# Patient Record
Sex: Female | Born: 1970
Health system: Southern US, Community
[De-identification: ages and names within clinical notes are randomized; demographics above are authoritative.]

## PROBLEM LIST (undated history)

## (undated) DIAGNOSIS — N809 Endometriosis, unspecified: Secondary | ICD-10-CM

## (undated) DIAGNOSIS — J45909 Unspecified asthma, uncomplicated: Secondary | ICD-10-CM

---

## 2016-12-06 ENCOUNTER — Emergency Department (HOSPITAL_COMMUNITY)
Admission: EM | Admit: 2016-12-06 | Discharge: 2016-12-06 | Disposition: A | Discharge status: Home / Self Care | Payer: Medicaid - Out of State | Attending: Emergency Medicine | Admitting: Emergency Medicine

## 2016-12-06 ENCOUNTER — Encounter (HOSPITAL_COMMUNITY): Payer: Self-pay | Admitting: Emergency Medicine

## 2016-12-06 DIAGNOSIS — G43A0 Cyclical vomiting, not intractable: Secondary | ICD-10-CM | POA: Diagnosis not present

## 2016-12-06 DIAGNOSIS — J45909 Unspecified asthma, uncomplicated: Secondary | ICD-10-CM | POA: Diagnosis not present

## 2016-12-06 DIAGNOSIS — R1013 Epigastric pain: Secondary | ICD-10-CM

## 2016-12-06 HISTORY — DX: Endometriosis, unspecified: N80.9

## 2016-12-06 HISTORY — DX: Unspecified asthma, uncomplicated: J45.909

## 2016-12-06 LAB — I-STAT BETA HCG BLOOD, ED (MC, WL, AP ONLY)

## 2016-12-06 LAB — CBC WITH DIFFERENTIAL/PLATELET
Basophils Absolute: 0 10*3/uL (ref 0.0–0.1)
Basophils Relative: 0 %
Eosinophils Absolute: 0 10*3/uL (ref 0.0–0.7)
Eosinophils Relative: 0 %
HCT: 33.8 % — ABNORMAL LOW (ref 36.0–46.0)
HEMOGLOBIN: 10.9 g/dL — AB (ref 12.0–15.0)
LYMPHS ABS: 1.4 10*3/uL (ref 0.7–4.0)
LYMPHS PCT: 9 %
MCH: 25.7 pg — AB (ref 26.0–34.0)
MCHC: 32.2 g/dL (ref 30.0–36.0)
MCV: 79.7 fL (ref 78.0–100.0)
Monocytes Absolute: 0.4 10*3/uL (ref 0.1–1.0)
Monocytes Relative: 2 %
NEUTROS PCT: 89 %
Neutro Abs: 14 10*3/uL — ABNORMAL HIGH (ref 1.7–7.7)
Platelets: 404 10*3/uL — ABNORMAL HIGH (ref 150–400)
RBC: 4.24 MIL/uL (ref 3.87–5.11)
RDW: 16.9 % — ABNORMAL HIGH (ref 11.5–15.5)
WBC: 15.8 10*3/uL — AB (ref 4.0–10.5)

## 2016-12-06 LAB — COMPREHENSIVE METABOLIC PANEL
ALK PHOS: 84 U/L (ref 38–126)
ALT: 19 U/L (ref 14–54)
AST: 19 U/L (ref 15–41)
Albumin: 4.3 g/dL (ref 3.5–5.0)
Anion gap: 9 (ref 5–15)
BUN: 13 mg/dL (ref 6–20)
CALCIUM: 9.7 mg/dL (ref 8.9–10.3)
CO2: 26 mmol/L (ref 22–32)
CREATININE: 0.68 mg/dL (ref 0.44–1.00)
Chloride: 101 mmol/L (ref 101–111)
Glucose, Bld: 127 mg/dL — ABNORMAL HIGH (ref 65–99)
Potassium: 3.6 mmol/L (ref 3.5–5.1)
Sodium: 136 mmol/L (ref 135–145)
Total Bilirubin: 0.2 mg/dL — ABNORMAL LOW (ref 0.3–1.2)
Total Protein: 8 g/dL (ref 6.5–8.1)

## 2016-12-06 LAB — LIPASE, BLOOD: LIPASE: 21 U/L (ref 11–51)

## 2016-12-06 MED ORDER — METOCLOPRAMIDE HCL 5 MG/ML IJ SOLN
10.0000 mg | Freq: Once | INTRAMUSCULAR | Status: AC
  Administered 2016-12-06: 10 mg via INTRAVENOUS
  Filled 2016-12-06: qty 2

## 2016-12-06 MED ORDER — ONDANSETRON 4 MG PO TBDP: 4.0000 mg | ORAL_TABLET | Freq: Three times a day (TID) | ORAL | 0 refills | Status: AC | PRN

## 2016-12-06 MED ORDER — GI COCKTAIL ~~LOC~~
30.0000 mL | Freq: Once | ORAL | Status: AC
  Administered 2016-12-06: 30 mL via ORAL
  Filled 2016-12-06: qty 30

## 2016-12-06 MED ORDER — ONDANSETRON HCL 4 MG/2ML IJ SOLN: 4.0000 mg | Freq: Once | INTRAMUSCULAR | Status: DC

## 2016-12-06 MED FILL — ONDANSETRON ODT 4 MG TABLET: 4 | 5 days supply | Qty: 15 | Fill #0

## 2016-12-06 NOTE — ED Triage Notes (Signed)
Per PT[facility], patient from home, c/o abdominal pain with N/V/D since 0300 today. Denies chest pain and SOB. Ambulatory.

## 2016-12-06 NOTE — ED Notes (Signed)
RX X 1 GIVEN 

## 2016-12-06 NOTE — ED Notes (Signed)
Pt was given Sprite and able to keep it down with no problem. RN aware.

## 2016-12-06 NOTE — ED Provider Notes (Signed)
[facility]-EMERGENCY DEPT Provider Note  CSN: [id number] Arrival date & time: 12/06/16 [id number]  Chief Complaint(s) Abdominal Pain  HPI Melissa Lyons is a 46 y.o. female who presents to the emergency department with 6 hours of intermittent abdominal discomfort with associated nausea and nonbloody nonbilious emesis.  Patient reports that she was partying last night and endorsed marijuana use, alcohol consumption.  States that she awoke this morning with the nausea resulting in vomiting.  Abdominal discomfort began after the emesis.  No alleviating or aggravating factors.  Abdominal pain is in epigastric and left upper quadrant.  Mild to moderate in intensity.  She denies any fevers, chills, diarrhea.  No known suspicious food intake.  States that she had grilled chicken salad which is what everybody else had.  No recent antibiotics or travel.  No known sick contacts.  HPI  Past Medical History Past Medical History:  Diagnosis Date  . Asthma   . Endometriosis    There are no active problems to display for this patient.  Home Medication(s) Prior to Admission medications   Medication Sig Start Date End Date Taking? Authorizing Provider  ondansetron (ZOFRAN ODT) 4 MG disintegrating tablet Take 1 tablet (4 mg total) by mouth every 8 (eight) hours as needed for up to 3 days for nausea or vomiting. 12/06/16 12/09/16  Nira Connardama, Pedro Eduardo, MD                                                                                                                                    Past Surgical History History reviewed. No pertinent surgical history. Family History No family history on file.  Social History Social History   Tobacco Use  . Smoking status: Not on file  Substance Use Topics  . Alcohol use: Not on file  . Drug use: Not on file   Allergies Penicillins  Review of Systems Review of Systems All other systems are reviewed and are negative for acute change except as  noted in the HPI  Physical Exam Vital Signs  I have reviewed the triage vital signs BP (!) 145/77   Pulse 92   Temp 99.8 F (37.7 C) (Oral)   Resp 20   Ht 5\' 4"  (1.626 m)   Wt 99.8 kg (220 lb)   SpO2 100%   BMI 37.76 kg/m   Physical Exam  Constitutional: She is oriented to person, place, and time. She appears well-developed and well-nourished. No distress.  HENT:  Head: Normocephalic and atraumatic.  Nose: Nose normal.  Eyes: Conjunctivae and EOM are normal. Pupils are equal, round, and reactive to light. Right eye exhibits no discharge. Left eye exhibits no discharge. No scleral icterus.  Neck: Normal range of motion. Neck supple.  Cardiovascular: Normal rate and regular rhythm. Exam reveals no gallop and no friction rub.  No murmur heard. Pulmonary/Chest: Effort normal and breath sounds normal. No stridor. No respiratory distress. She has no  rales.  Abdominal: Soft. She exhibits no distension. There is no tenderness (palpation of abdomen improves pain). There is no rigidity, no rebound, no guarding and no CVA tenderness. No hernia.  Musculoskeletal: She exhibits no edema or tenderness.  Neurological: She is alert and oriented to person, place, and time.  Skin: Skin is warm and dry. No rash noted. She is not diaphoretic. No erythema.  Psychiatric: She has a normal mood and affect.  Vitals reviewed.   ED Results and Treatments Labs (all labs ordered are listed, but only abnormal results are displayed) Labs Reviewed  CBC WITH DIFFERENTIAL/PLATELET - Abnormal; Notable for the following components:      Result Value   WBC 15.8 (*)    Hemoglobin 10.9 (*)    HCT 33.8 (*)    MCH 25.7 (*)    RDW 16.9 (*)    Platelets 404 (*)    Neutro Abs 14.0 (*)    All other components within normal limits  COMPREHENSIVE METABOLIC PANEL - Abnormal; Notable for the following components:   Glucose, Bld 127 (*)    Total Bilirubin 0.2 (*)    All other components within normal limits    LIPASE, BLOOD  I-STAT BETA HCG BLOOD, ED ([facility], [facility], [facility] ONLY)                                                                                                                         EKG  EKG Interpretation  Date/Time:    Ventricular Rate:    PR Interval:    QRS Duration:   QT Interval:    QTC Calculation:   R Axis:     Text Interpretation:        Radiology No results found. Pertinent labs & imaging results that were available during my care of the patient were reviewed by me and considered in my medical decision making (see chart for details).  Medications Ordered in ED Medications  gi cocktail (Maalox,Lidocaine,Donnatal) (30 mLs Oral Given 12/06/16 1019)  metoCLOPramide (REGLAN) injection 10 mg (10 mg Intravenous Given 12/06/16 1018)                                                                                                                                    Procedures Procedures  EMERGENCY DEPARTMENT BILIARY ULTRASOUND INTERPRETATION "Study: Limited Abdominal Ultrasound of the Gallbladder and Common Bile Duct."  INDICATIONS: Abdominal pain and Nausea  and vomiting Indication: Multiple views of the gallbladder and common bile duct were obtained in real-time with a Multi-frequency probe."  PERFORMED BY:  Myself IMAGES ARCHIVED?: Yes LIMITATIONS: Body habitus and Bowel gas INTERPRETATION: Normal, Gallbladder wall normal in thickness, Sonographic Murphy's sign abscent and Common bile duct normal in size   (including critical care time)  Medical Decision Making / ED Course I have reviewed the nursing notes for this encounter and the patient's prior records (if available in EHR or on provided paperwork).    Epigastric and left upper quadrant abdominal pain with associated nausea and vomiting.  Abdomen benign.  Palpation actually improved the patient's abdominal discomfort.  Labs did reveal leukocytosis which is likely demargination from the vomiting.  Otherwise labs are  grossly reassuring without evidence of biliary obstruction or pancreatitis.  Bedside ultrasound without cholelithiasis or evidence of adjusting acute cholecystitis.  Patient was treated symptomatically with antiemetic.  Able to tolerate oral intake.  Low suspicion for serious intra-abdominal inflammatory/infectious process/small bowel obstruction.  No need for advanced imaging at this time.  The patient appears reasonably screened and/or stabilized for discharge and I doubt any other medical condition or other [facility] requiring further screening, evaluation, or treatment in the ED at this time prior to discharge.  The patient is safe for discharge with strict return precautions.   Final Clinical Impression(s) / ED Diagnoses Final diagnoses:  Epigastric pain  Cyclical vomiting with nausea, intractability of vomiting not specified   Disposition: Discharge  Condition: Good  I have discussed the results, Dx and Tx plan with the patient who expressed understanding and agree(s) with the plan. Discharge instructions discussed at great length. The patient was given strict return precautions who verbalized understanding of the instructions. No further questions at time of discharge.    ED Discharge Orders        Ordered    ondansetron (ZOFRAN ODT) 4 MG disintegrating tablet  Every 8 hours PRN     12/06/16 1243       Follow Up: Primary care provider   in 5-7 days, If symptoms do not improve or  worsen      This chart was dictated using voice recognition software.  Despite best efforts to proofread,  errors can occur which can change the documentation meaning.   Nira Connardama, Pedro Eduardo, MD 12/06/16 1248

## 2016-12-06 NOTE — ED Notes (Signed)
URINE COLLECTED AT BEDSIDE

## 2016-12-06 NOTE — ED Notes (Signed)
ED Provider at bedside. 

## 2016-12-06 NOTE — ED Notes (Signed)
Bed: WA06 Expected date:  Expected time:  Means of arrival:  Comments: EMS abd pain 

## 2016-12-06 NOTE — Discharge Instructions (Signed)

## 2017-04-17 ENCOUNTER — Encounter (HOSPITAL_COMMUNITY): Payer: Self-pay | Admitting: Emergency Medicine

## 2017-04-17 ENCOUNTER — Emergency Department (HOSPITAL_COMMUNITY)
Admission: EM | Admit: 2017-04-17 | Discharge: 2017-04-18 | Disposition: A | Discharge status: Home / Self Care | Payer: Medicaid - Out of State | Attending: Emergency Medicine | Admitting: Emergency Medicine

## 2017-04-17 ENCOUNTER — Emergency Department (HOSPITAL_COMMUNITY)
Admission: EM | Admit: 2017-04-17 | Discharge: 2017-04-17 | Discharge status: Against Medical Advice | Payer: Medicaid - Out of State | Source: Home / Self Care | Attending: Emergency Medicine | Admitting: Emergency Medicine

## 2017-04-17 ENCOUNTER — Emergency Department (HOSPITAL_COMMUNITY): Payer: Medicaid - Out of State

## 2017-04-17 ENCOUNTER — Other Ambulatory Visit: Payer: Self-pay

## 2017-04-17 DIAGNOSIS — R112 Nausea with vomiting, unspecified: Secondary | ICD-10-CM | POA: Diagnosis not present

## 2017-04-17 DIAGNOSIS — F1290 Cannabis use, unspecified, uncomplicated: Secondary | ICD-10-CM | POA: Diagnosis not present

## 2017-04-17 DIAGNOSIS — J45909 Unspecified asthma, uncomplicated: Secondary | ICD-10-CM | POA: Insufficient documentation

## 2017-04-17 DIAGNOSIS — R1010 Upper abdominal pain, unspecified: Secondary | ICD-10-CM | POA: Diagnosis present

## 2017-04-17 DIAGNOSIS — R109 Unspecified abdominal pain: Secondary | ICD-10-CM | POA: Diagnosis not present

## 2017-04-17 DIAGNOSIS — F17210 Nicotine dependence, cigarettes, uncomplicated: Secondary | ICD-10-CM | POA: Insufficient documentation

## 2017-04-17 LAB — COMPREHENSIVE METABOLIC PANEL
ALBUMIN: 4.2 g/dL (ref 3.5–5.0)
ALK PHOS: 78 U/L (ref 38–126)
ALT: 17 U/L (ref 14–54)
ANION GAP: 12 (ref 5–15)
AST: 19 U/L (ref 15–41)
BUN: 5 mg/dL — ABNORMAL LOW (ref 6–20)
CALCIUM: 9.5 mg/dL (ref 8.9–10.3)
CO2: 22 mmol/L (ref 22–32)
CREATININE: 0.77 mg/dL (ref 0.44–1.00)
Chloride: 104 mmol/L (ref 101–111)
GFR calc Af Amer: >60 mL/min (ref >60)
GFR calc non Af Amer: >60 mL/min (ref >60)
GLUCOSE: 139 mg/dL — AB (ref 65–99)
Potassium: 3.5 mmol/L (ref 3.5–5.1)
SODIUM: 138 mmol/L (ref 135–145)
Total Bilirubin: 0.6 mg/dL (ref 0.3–1.2)
Total Protein: 7.9 g/dL (ref 6.5–8.1)

## 2017-04-17 LAB — CBC WITH DIFFERENTIAL/PLATELET
BASOS ABS: 0 10*3/uL (ref 0.0–0.1)
Basophils Relative: 0 %
EOS PCT: 0 %
Eosinophils Absolute: 0.1 10*3/uL (ref 0.0–0.7)
HEMATOCRIT: 39.9 % (ref 36.0–46.0)
Hemoglobin: 13 g/dL (ref 12.0–15.0)
LYMPHS ABS: 2.6 10*3/uL (ref 0.7–4.0)
LYMPHS PCT: 19 %
MCH: 27.9 pg (ref 26.0–34.0)
MCHC: 32.6 g/dL (ref 30.0–36.0)
MCV: 85.6 fL (ref 78.0–100.0)
Monocytes Absolute: 0.4 10*3/uL (ref 0.1–1.0)
Monocytes Relative: 3 %
NEUTROS ABS: 10.6 10*3/uL — AB (ref 1.7–7.7)
Neutrophils Relative %: 78 %
PLATELETS: 349 10*3/uL (ref 150–400)
RBC: 4.66 MIL/uL (ref 3.87–5.11)
RDW: 16.6 % — ABNORMAL HIGH (ref 11.5–15.5)
WBC: 13.7 10*3/uL — AB (ref 4.0–10.5)

## 2017-04-17 LAB — LIPASE, BLOOD: LIPASE: 20 U/L (ref 11–51)

## 2017-04-17 LAB — I-STAT BETA HCG BLOOD, ED (MC, WL, AP ONLY): I-stat hCG, quantitative: 5.6 m[IU]/mL — ABNORMAL HIGH (ref <5)

## 2017-04-17 MED ORDER — ONDANSETRON 4 MG PO TBDP
8.0000 mg | ORAL_TABLET | Freq: Once | ORAL | Status: AC
  Administered 2017-04-17: 8 mg via ORAL
  Filled 2017-04-17: qty 2

## 2017-04-17 MED ORDER — ACETAMINOPHEN 325 MG PO TABS
650.0000 mg | ORAL_TABLET | Freq: Once | ORAL | Status: AC
  Administered 2017-04-17: 650 mg via ORAL
  Filled 2017-04-17: qty 2

## 2017-04-17 MED ORDER — ONDANSETRON 4 MG PO TBDP
4.0000 mg | ORAL_TABLET | Freq: Once | ORAL | Status: AC
  Administered 2017-04-17: 4 mg via ORAL
  Filled 2017-04-17: qty 1

## 2017-04-17 NOTE — ED Notes (Signed)
Pregnancy negative per LAB

## 2017-04-17 NOTE — ED Notes (Signed)
Pt called x3. No response 

## 2017-04-17 NOTE — ED Notes (Signed)
Pt is putting her finger in her mouth to induce vomiting. Witnessed this x 2.

## 2017-04-17 NOTE — ED Triage Notes (Signed)
Pt to ED via [facility] with c/o generalized abd pain onset this am.  Pt was here earlier today and left after triage because she said she couldn't wait that long.  After pt left ED she called EMS and returned to ED

## 2017-04-17 NOTE — ED Notes (Signed)
Pt called x 2 in all areas of lobby with no response.

## 2017-04-17 NOTE — ED Triage Notes (Signed)
Pt complains of generalized abd pain that started this morning. Pt also reports n/v. Denies diarrhea. Pt  feeling SOB. Denies CP.

## 2017-04-17 NOTE — ED Provider Notes (Signed)
Patient placed in Quick Look pathway, seen and evaluated   Chief Complaint: abd pain  HPI:   Pt is a 46y/o female with a PMHx of endometriosis, and PSHx of C/S and laparoscopy for endometriosis, here with c/o generalized abdominal pain that began around 9 AM with associated nausea and too numerous to count episodes of nonbloody nonbilious emesis.  She is a very poor historian as she keeps writhing around the chair moaning about needing help and has to be redirected multiple times during the evaluation.  She denies having any fevers, diarrhea, constipation, melena, hematochezia, hematemesis, dysuria, hematuria, vaginal bleeding or discharge, or any other complaints at this time.  She still has her gallbladder. Denies having any other abd surgeries aside from laparoscopy for endometriosis and a C/S.   ROS: +abd pain, +n/v. No fevers, diarrhea, constipation, melena, hematochezia, hematemesis, urinary symptoms, or vaginal symptoms.   Physical Exam:  BP (!) 124/104 (BP Location: Right Arm)   Pulse 100   Temp 99 F (37.2 C) (Oral)   Resp 20   LMP 04/09/2017   SpO2 100%    Gen: laying on the chair, writhing around complaining of pain, but easily redirected and calmed down. Refuses to sit up or stay still for much of the evaluation. Has her feet up on the chair and is kicking them around, nearly striking the lab technician  Neuro: Awake and Alert  Skin: Warm    Focused Exam: abdomen soft, obese but nondistended, +BS throughout, with mild RUQ/epigastric TTP, she complains of generalized abd tenderness however when she's distracted she doesn't appear to show objective signs of tenderness to anywhere except the RUQ/epigastric areas; no r/g/r, +murphy's, neg mcburney's, no CVA TTP    Initiation of care has begun. The patient has been counseled on the process, plan, and necessity for staying for the completion/evaluation, and the remainder of the medical screening examination     [address], [place],  [place] 04/17/17 1646    Gerhard MunchLockwood, Robert, MD 04/28/17 1501

## 2017-04-17 NOTE — ED Notes (Signed)
Pt vomiting loudly in triage, reports increased abdominal pain.

## 2017-04-18 LAB — CBC
HEMATOCRIT: 37.3 % (ref 36.0–46.0)
HEMOGLOBIN: 12.6 g/dL (ref 12.0–15.0)
MCH: 28.4 pg (ref 26.0–34.0)
MCHC: 33.8 g/dL (ref 30.0–36.0)
MCV: 84.2 fL (ref 78.0–100.0)
Platelets: 367 10*3/uL (ref 150–400)
RBC: 4.43 MIL/uL (ref 3.87–5.11)
RDW: 15.9 % — AB (ref 11.5–15.5)
WBC: 16.5 10*3/uL — ABNORMAL HIGH (ref 4.0–10.5)

## 2017-04-18 LAB — COMPREHENSIVE METABOLIC PANEL
ALBUMIN: 4.3 g/dL (ref 3.5–5.0)
ALK PHOS: 73 U/L (ref 38–126)
ALT: 18 U/L (ref 14–54)
AST: 17 U/L (ref 15–41)
Anion gap: 14 (ref 5–15)
BUN: 9 mg/dL (ref 6–20)
CALCIUM: 9.7 mg/dL (ref 8.9–10.3)
CO2: 21 mmol/L — AB (ref 22–32)
CREATININE: 0.73 mg/dL (ref 0.44–1.00)
Chloride: 102 mmol/L (ref 101–111)
GFR calc Af Amer: >60 mL/min (ref >60)
GFR calc non Af Amer: >60 mL/min (ref >60)
GLUCOSE: 127 mg/dL — AB (ref 65–99)
Potassium: 3.2 mmol/L — ABNORMAL LOW (ref 3.5–5.1)
SODIUM: 137 mmol/L (ref 135–145)
Total Bilirubin: 0.4 mg/dL (ref 0.3–1.2)
Total Protein: 8.1 g/dL (ref 6.5–8.1)

## 2017-04-18 LAB — URINALYSIS, ROUTINE W REFLEX MICROSCOPIC
Bilirubin Urine: NEGATIVE
Glucose, UA: NEGATIVE mg/dL
Hgb urine dipstick: NEGATIVE
Ketones, ur: 20 mg/dL — AB
Leukocytes, UA: NEGATIVE
NITRITE: NEGATIVE
PH: 8 (ref 5.0–8.0)
Protein, ur: 100 mg/dL — AB
SPECIFIC GRAVITY, URINE: 1.02 (ref 1.005–1.030)

## 2017-04-18 LAB — RAPID URINE DRUG SCREEN, HOSP PERFORMED
AMPHETAMINES: NOT DETECTED
BARBITURATES: NOT DETECTED
BENZODIAZEPINES: NOT DETECTED
Cocaine: NOT DETECTED
Opiates: POSITIVE — AB
Tetrahydrocannabinol: POSITIVE — AB

## 2017-04-18 LAB — PREGNANCY, URINE: Preg Test, Ur: NEGATIVE

## 2017-04-18 LAB — I-STAT BETA HCG BLOOD, ED (MC, WL, AP ONLY): I-stat hCG, quantitative: <5 m[IU]/mL (ref <5)

## 2017-04-18 LAB — LIPASE, BLOOD: Lipase: 20 U/L (ref 11–51)

## 2017-04-18 MED ORDER — ONDANSETRON 4 MG PO TBDP: 4.0000 mg | ORAL_TABLET | Freq: Four times a day (QID) | ORAL | 0 refills | Status: AC | PRN

## 2017-04-18 MED ORDER — ONDANSETRON HCL 4 MG/2ML IJ SOLN
INTRAMUSCULAR | Status: AC
  Administered 2017-04-18: 4 mg via INTRAVENOUS
  Filled 2017-04-18: qty 2

## 2017-04-18 MED ORDER — GI COCKTAIL ~~LOC~~
30.0000 mL | Freq: Once | ORAL | Status: AC
  Administered 2017-04-18: 30 mL via ORAL
  Filled 2017-04-18: qty 30

## 2017-04-18 MED ORDER — PANTOPRAZOLE SODIUM 40 MG PO TBEC
40.0000 mg | DELAYED_RELEASE_TABLET | Freq: Once | ORAL | Status: AC
  Administered 2017-04-18: 40 mg via ORAL
  Filled 2017-04-18: qty 1

## 2017-04-18 MED ORDER — HALOPERIDOL LACTATE 5 MG/ML IJ SOLN
2.0000 mg | Freq: Once | INTRAMUSCULAR | Status: AC
  Administered 2017-04-18: 2 mg via INTRAVENOUS
  Filled 2017-04-18: qty 1

## 2017-04-18 MED ORDER — SODIUM CHLORIDE 0.9 % IV BOLUS
1000.0000 mL | Freq: Once | INTRAVENOUS | Status: AC
  Administered 2017-04-18: 1000 mL via INTRAVENOUS

## 2017-04-18 MED ORDER — DICYCLOMINE HCL 10 MG/ML IM SOLN
20.0000 mg | Freq: Once | INTRAMUSCULAR | Status: AC
  Administered 2017-04-18: 20 mg via INTRAMUSCULAR
  Filled 2017-04-18: qty 2

## 2017-04-18 MED ORDER — DICYCLOMINE HCL 20 MG PO TABS: 20.0000 mg | ORAL_TABLET | Freq: Three times a day (TID) | ORAL | 0 refills | Status: AC | PRN

## 2017-04-18 MED ORDER — ONDANSETRON HCL 4 MG/2ML IJ SOLN
4.0000 mg | Freq: Once | INTRAMUSCULAR | Status: AC | PRN
  Administered 2017-04-18: 4 mg via INTRAVENOUS

## 2017-04-18 NOTE — ED Provider Notes (Signed)
TIME SEEN: 3:09 AM  CHIEF COMPLAINT: Abdominal pain, vomiting  HPI: Patient is a 47 year old female with history of endometriosis with previous laparoscopic surgery for endometriosis who presents to the emergency department with complaints of upper abdominal pain and vomiting since 8 AM yesterday.  She has a history of same and has history of marijuana use.  States she has not smoked marijuana recently.  Denies any other drug or alcohol use.  No chest pain or shortness of breath.  No diarrhea.  No bloody stool or melena.  No dysuria, vaginal bleeding or discharge.  ROS: See HPI Constitutional: no fever  Eyes: no drainage  ENT: no runny nose   Cardiovascular:  no chest pain  Resp: no SOB  GI:  vomiting GU: no dysuria Integumentary: no rash  Allergy: no hives  Musculoskeletal: no leg swelling  Neurological: no slurred speech ROS otherwise negative  PAST MEDICAL HISTORY/PAST SURGICAL HISTORY:  Past Medical History:  Diagnosis Date  . Asthma   . Endometriosis     MEDICATIONS:  Prior to Admission medications   Not on File    ALLERGIES:  Allergies  Allergen Reactions  . Penicillins     Unknown reaction  Has patient had a PCN reaction causing immediate rash, facial/tongue/throat swelling, SOB or lightheadedness with hypotension: Unknown Has patient had a PCN reaction causing severe rash involving mucus membranes or skin necrosis: Unknown Has patient had a PCN reaction that required hospitalization: Unknown Has patient had a PCN reaction occurring within the last 10 years: Unknown If all of the above answers are "NO", then may proceed with Cephalosporin use.     SOCIAL HISTORY:  Social History   Tobacco Use  . Smoking status: Current Every Day Smoker    Types: Cigarettes  . Smokeless tobacco: Never Used  Substance Use Topics  . Alcohol use: Not Currently    FAMILY HISTORY: No family history on file.  EXAM: BP (!) 186/90 (BP Location: Right Arm)   Pulse 88   Temp  99.5 F (37.5 C)   Resp 20   Ht 5\' 4"  (1.626 m)   Wt 99.8 kg (220 lb)   LMP 04/09/2017   SpO2 100%   BMI 37.76 kg/m  CONSTITUTIONAL: Alert and oriented and responds appropriately to questions.  Appears uncomfortable.  Moving around in the bed frequently.  Unable to get her to sit still. HEAD: Normocephalic EYES: Conjunctivae clear, pupils appear equal, EOMI ENT: normal nose; moist mucous membranes NECK: Supple, no meningismus, no nuchal rigidity, no LAD  CARD: Regular and tachycardic; S1 and S2 appreciated; no murmurs, no clicks, no rubs, no gallops RESP: Normal chest excursion without splinting or tachypnea; breath sounds clear and equal bilaterally; no wheezes, no rhonchi, no rales, no hypoxia or respiratory distress, speaking full sentences ABD/GI: Normal bowel sounds; non-distended; soft, non-tender, no rebound, no guarding, no peritoneal signs, no hepatosplenomegaly BACK:  The back appears normal and is non-tender to palpation, there is no CVA tenderness EXT: Normal ROM in all joints; non-tender to palpation; no edema; normal capillary refill; no cyanosis, no calf tenderness or swelling    SKIN: Normal color for age and race; warm; no rash NEURO: Moves all extremities equally PSYCH: The patient's mood and manner are appropriate. Grooming and personal hygiene are appropriate.  MEDICAL DECISION MAKING: Patient here with upper abdominal pain, vomiting.  Her abdominal exam is completely benign.  Negative Murphy sign and no tenderness at McBurney's point.  Doubt appendicitis, cholecystitis, pancreatitis, perforated ulcer.  Labs obtained  earlier today are unremarkable.  She is not pregnant.  Will obtain urine drug screen as I suspect this may be related to cannabinoid hyperemesis.  Will give IV Haldol, Protonix, IV fluids and GI cocktail.  I do not feel she needs imaging at this time.  ED PROGRESS: Patient improving with IV Haldol.  Will fluid challenge patient.   Patient able to tolerate  oral fluids without difficulty.  Reports some abdominal cramping after drinking but reports it is feeling much better.  Will give IM Bentyl and I feel she is safe to be discharged.  Have advised her to stop smoking marijuana.  Her drug screen today is positive for opiates and THC.  I feel she is safe to be discharged home.   At this time, I do not feel there is any life-threatening condition present. I have reviewed and discussed all results (EKG, imaging, lab, urine as appropriate) and exam findings with patient/family. I have reviewed nursing notes and appropriate previous records.  I feel the patient is safe to be discharged home without further emergent workup and can continue workup as an outpatient as needed. Discussed usual and customary return precautions. Patient/family verbalize understanding and are comfortable with this plan.  Outpatient follow-up has been provided if needed. All questions have been answered.     Genella Bas, Layla Maw, DO 04/18/17 [phone]

## 2017-04-18 NOTE — Discharge Instructions (Addendum)
I recommend that you stop smoking marijuana.  Please follow-up with a primary care physician.  Please eat a bland diet for the next several days.  Please drink between 60-80 ounces of water a day.

## 2019-01-17 ENCOUNTER — Other Ambulatory Visit: Payer: Self-pay | Admitting: Critical Care Medicine

## 2019-01-17 DIAGNOSIS — Z20822 Contact with and (suspected) exposure to covid-19: Secondary | ICD-10-CM

## 2019-01-18 LAB — NOVEL CORONAVIRUS, NAA: SARS-CoV-2, NAA: NOT DETECTED

## 2019-02-07 ENCOUNTER — Other Ambulatory Visit: Payer: Self-pay

## 2019-02-07 DIAGNOSIS — Z20822 Contact with and (suspected) exposure to covid-19: Secondary | ICD-10-CM

## 2019-02-09 LAB — NOVEL CORONAVIRUS, NAA: SARS-CoV-2, NAA: NOT DETECTED

## 2020-01-07 IMAGING — US US ABDOMEN COMPLETE
1 series · 14 of 25 positions shown · non-contrast
Comparison: None.

CLINICAL DATA: Right upper quadrant pain

EXAM:
ABDOMEN ULTRASOUND COMPLETE

[Series 1: us abdomen complete · 0.22mm/px · 14 of 78 slices shown]
[im 1/78]
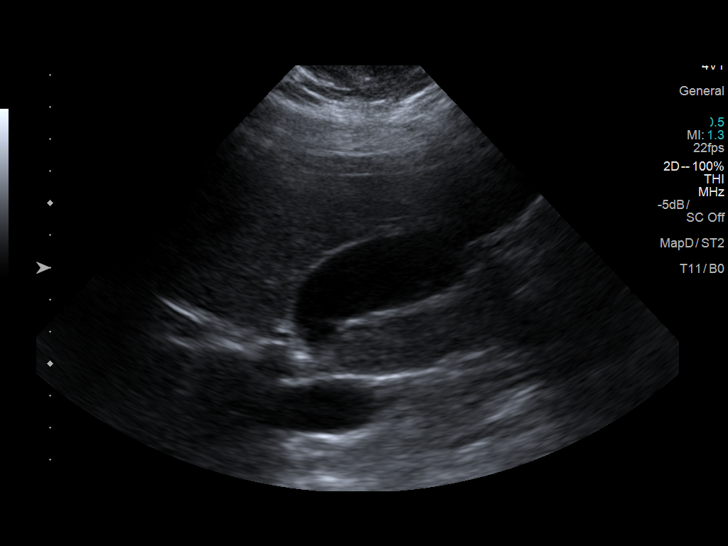
[im 7/78]
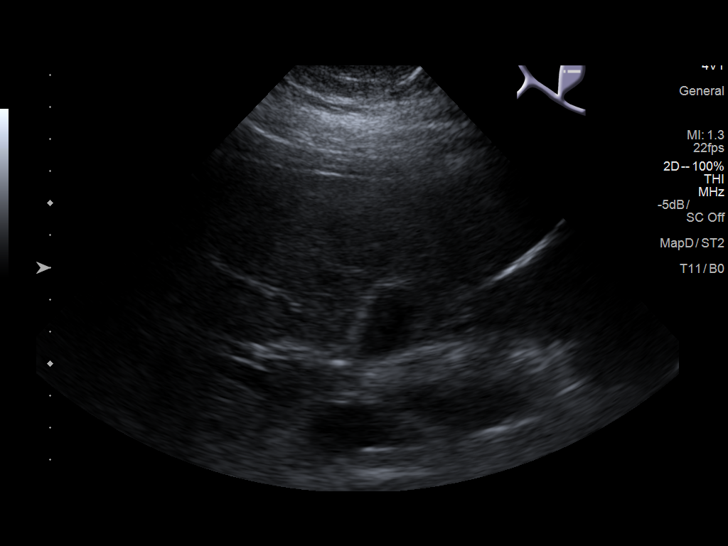
[im 13/78]
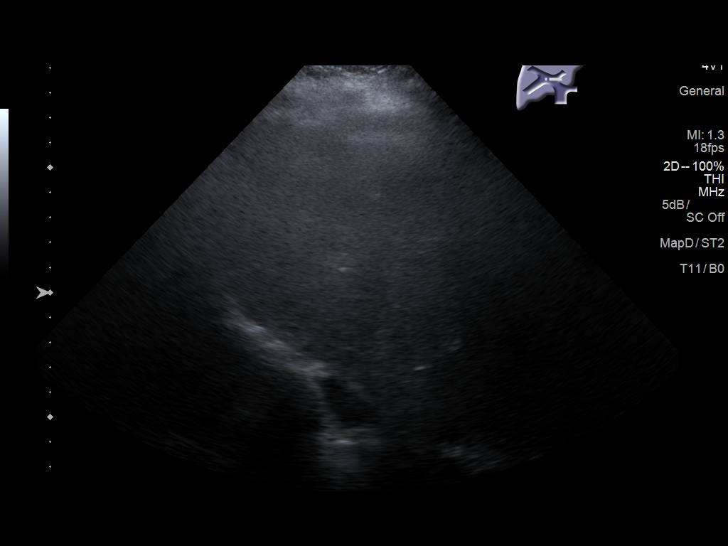
[im 20/78]
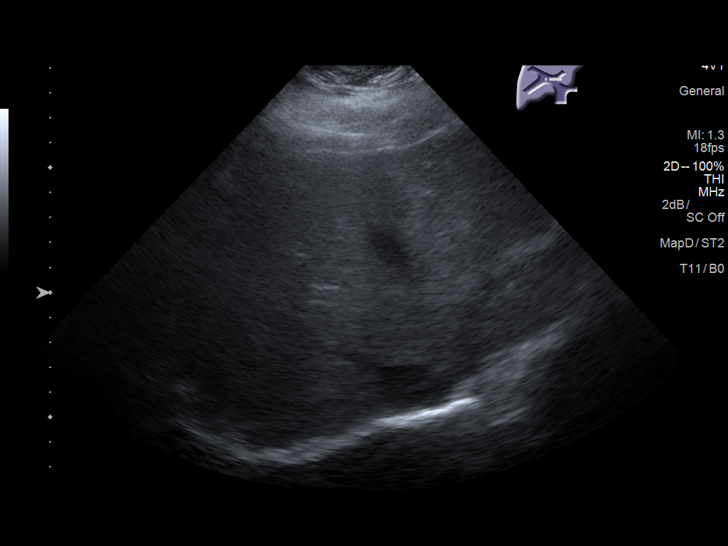
[im 26/78]
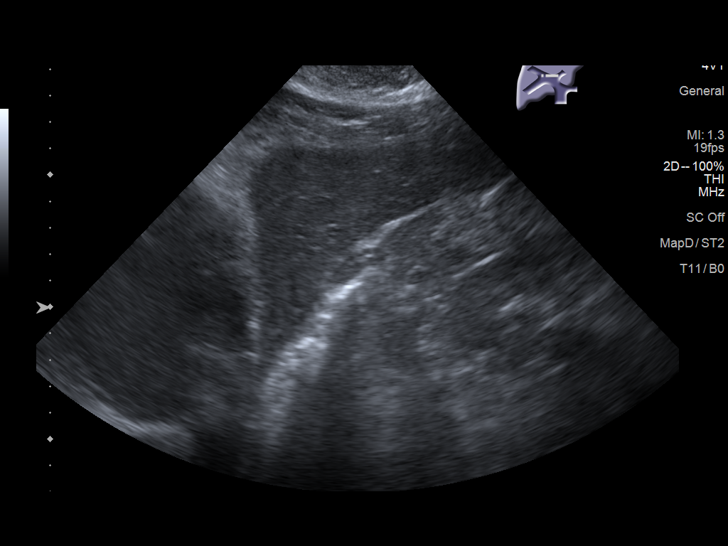
[im 29/78]
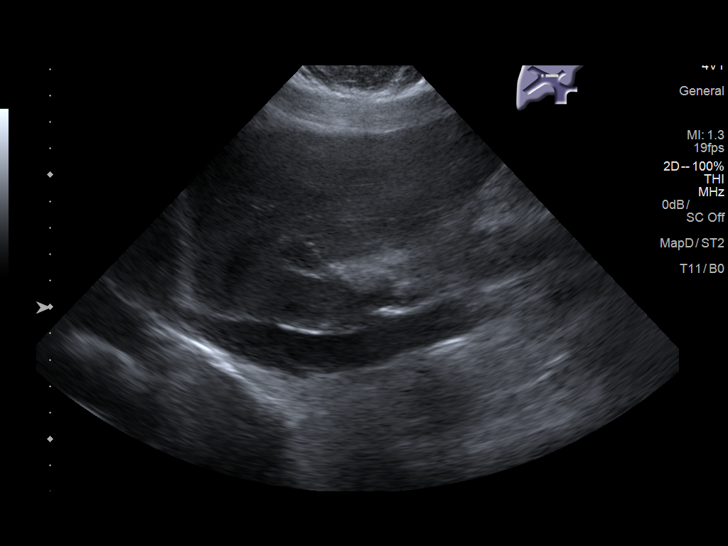
[im 36/78]
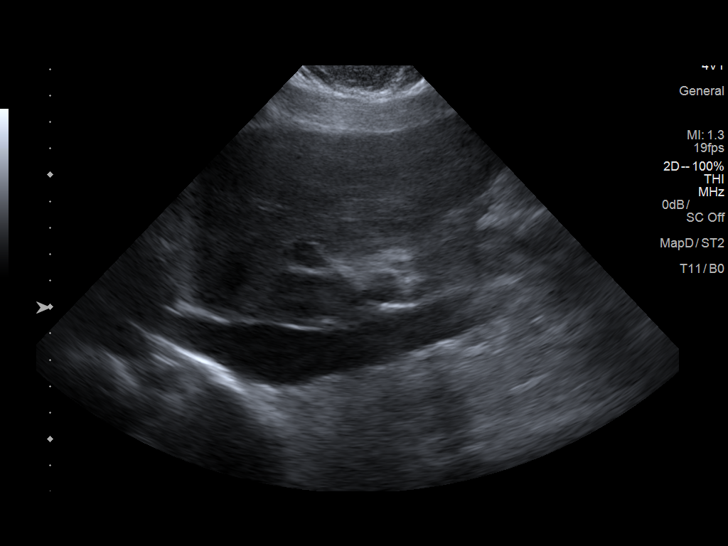
[im 42/78]
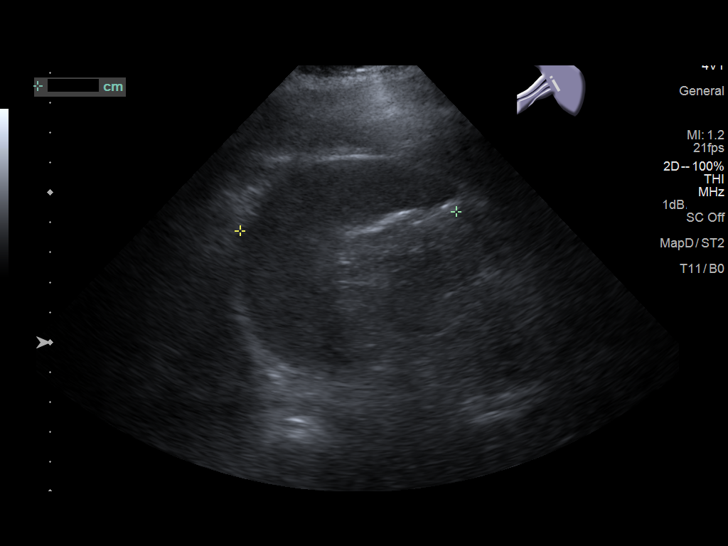
[im 49/78]
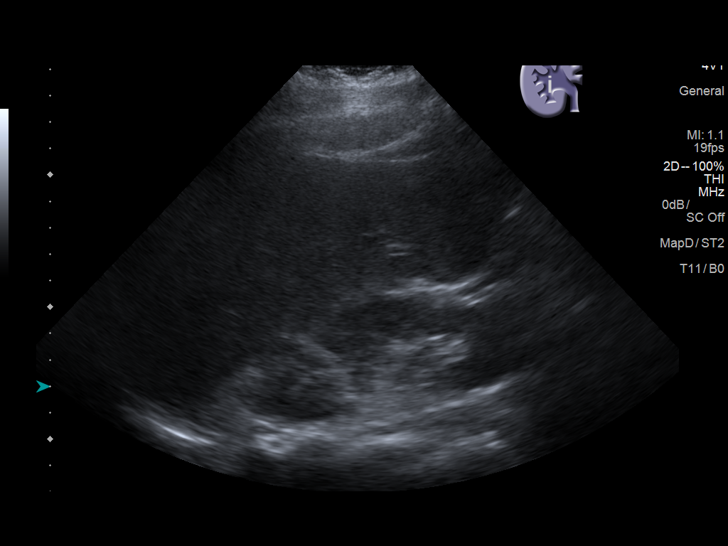
[im 52/78]
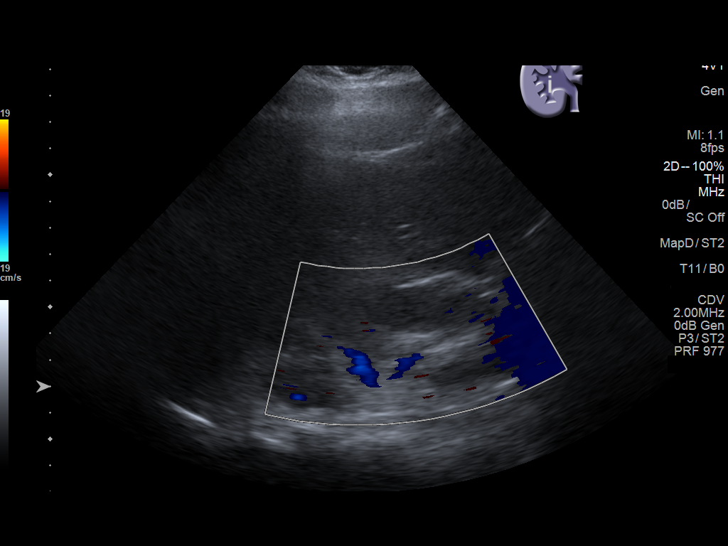
[im 58/78]
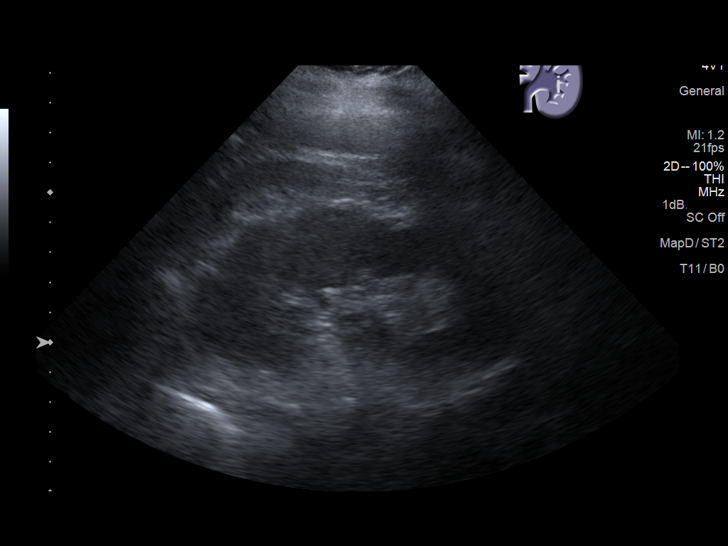
[im 65/78]
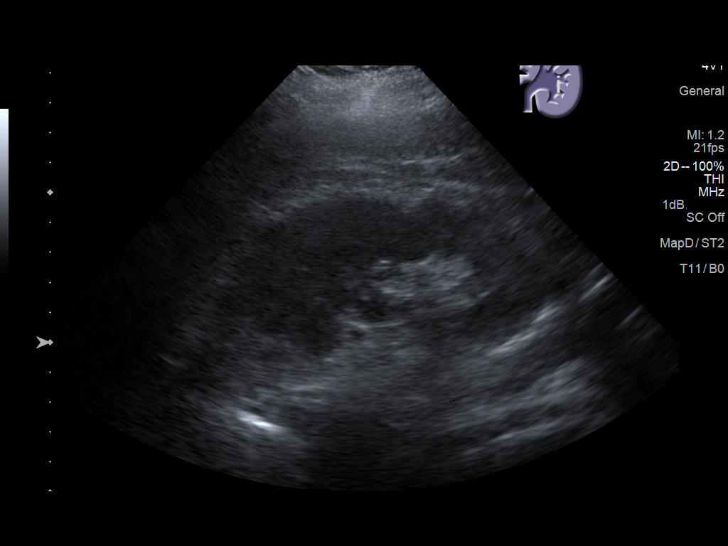
[im 71/78]
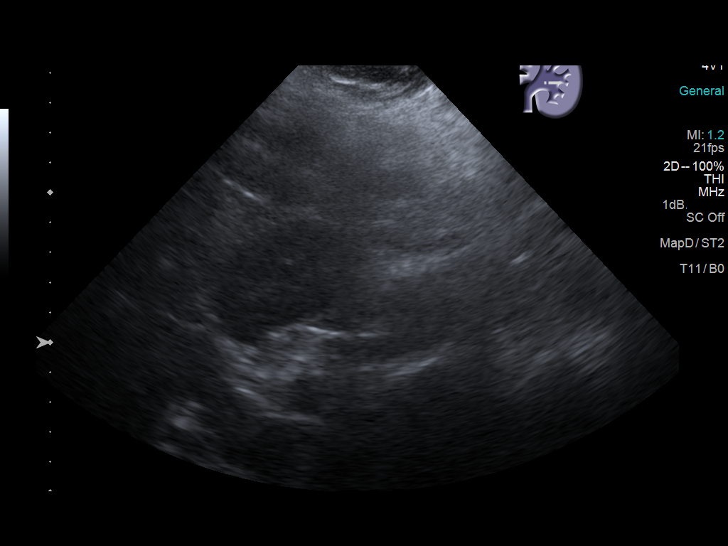
[im 78/78]
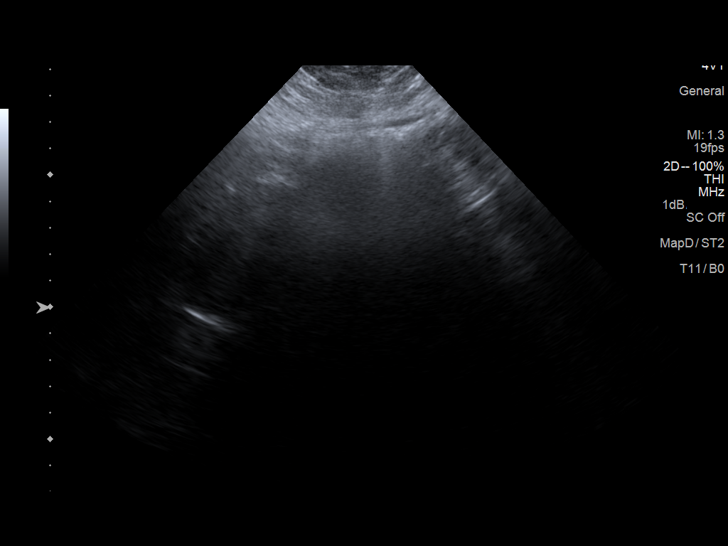

[14 of 25 positions shown; findings below may reference images not displayed]

FINDINGS: Gallbladder: No gallstones or wall thickening visualized. There is
no pericholecystic fluid. No sonographic Murphy sign noted by
sonographer.

Common bile duct: Diameter: 4 mm. No intrahepatic, common hepatic,
or common bile duct dilatation.

Liver: No focal lesion identified. Within normal limits in
parenchymal echogenicity. Portal vein is patent on color Doppler
imaging with normal direction of blood flow towards the liver.

IVC: No abnormality visualized.

Pancreas: No evident pancreatic mass or inflammatory focus.

Spleen: Size and appearance within normal limits.

Right Kidney: Length: 11.5 cm. Echogenicity within normal limits. No
mass or hydronephrosis visualized.

Left Kidney: Length: 11.4 cm. Echogenicity within normal limits. No
mass or hydronephrosis visualized.

Abdominal aorta: No aneurysm visualized.

Other findings: No demonstrable ascites.
IMPRESSION: Study within normal limits.
